# Patient Record
Sex: Male | Born: 1995 | Race: Black or African American | Hispanic: No | Marital: Single | Smoking: Current every day smoker
Health system: Southern US, Community
[De-identification: ages and names within clinical notes are randomized; demographics above are authoritative.]

---

## 2017-02-02 ENCOUNTER — Emergency Department (HOSPITAL_COMMUNITY)
Admission: EM | Admit: 2017-02-02 | Discharge: 2017-02-02 | Disposition: A | Discharge status: Home / Self Care | Payer: No Typology Code available for payment source | Attending: Emergency Medicine | Admitting: Emergency Medicine

## 2017-02-02 ENCOUNTER — Emergency Department (HOSPITAL_COMMUNITY): Payer: No Typology Code available for payment source

## 2017-02-02 ENCOUNTER — Encounter (HOSPITAL_COMMUNITY): Payer: Self-pay | Admitting: Emergency Medicine

## 2017-02-02 DIAGNOSIS — Y939 Activity, unspecified: Secondary | ICD-10-CM | POA: Insufficient documentation

## 2017-02-02 DIAGNOSIS — S161XXA Strain of muscle, fascia and tendon at neck level, initial encounter: Secondary | ICD-10-CM | POA: Diagnosis not present

## 2017-02-02 DIAGNOSIS — R51 Headache: Secondary | ICD-10-CM | POA: Insufficient documentation

## 2017-02-02 DIAGNOSIS — Y999 Unspecified external cause status: Secondary | ICD-10-CM | POA: Insufficient documentation

## 2017-02-02 DIAGNOSIS — Y929 Unspecified place or not applicable: Secondary | ICD-10-CM | POA: Insufficient documentation

## 2017-02-02 DIAGNOSIS — M7918 Myalgia, other site: Secondary | ICD-10-CM | POA: Diagnosis not present

## 2017-02-02 DIAGNOSIS — F17210 Nicotine dependence, cigarettes, uncomplicated: Secondary | ICD-10-CM | POA: Diagnosis not present

## 2017-02-02 DIAGNOSIS — M542 Cervicalgia: Secondary | ICD-10-CM | POA: Diagnosis present

## 2017-02-02 MED ORDER — IBUPROFEN 200 MG PO TABS
600.0000 mg | ORAL_TABLET | Freq: Once | ORAL | Status: AC
  Administered 2017-02-02: 600 mg via ORAL
  Filled 2017-02-02: qty 3

## 2017-02-02 MED ORDER — CYCLOBENZAPRINE HCL 10 MG PO TABS
10.0000 mg | ORAL_TABLET | Freq: Once | ORAL | Status: AC
  Administered 2017-02-02: 10 mg via ORAL
  Filled 2017-02-02: qty 1

## 2017-02-02 MED ORDER — IBUPROFEN 600 MG PO TABS: 600.0000 mg | ORAL_TABLET | Freq: Four times a day (QID) | ORAL | 0 refills | Status: AC | PRN

## 2017-02-02 MED ORDER — CYCLOBENZAPRINE HCL 10 MG PO TABS: 10.0000 mg | ORAL_TABLET | Freq: Two times a day (BID) | ORAL | 0 refills | Status: AC | PRN
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
## 2017-02-02 NOTE — ED Notes (Signed)
Bed: WTR8 Expected date:  Expected time:  Means of arrival:  Comments: 
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
